# Patient Record
Sex: Female | Born: 1995 | Race: White | Hispanic: No | Marital: Single | Smoking: Never smoker
Health system: Southern US, Community
[De-identification: ages and names within clinical notes are randomized; demographics above are authoritative.]

## PROBLEM LIST (undated history)

## (undated) DIAGNOSIS — K900 Celiac disease: Secondary | ICD-10-CM

## (undated) HISTORY — DX: Celiac disease: K90.0

---
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
## 2017-09-09 ENCOUNTER — Other Ambulatory Visit: Payer: Self-pay

## 2017-09-09 ENCOUNTER — Encounter: Payer: Self-pay | Admitting: Physician Assistant

## 2017-09-09 ENCOUNTER — Ambulatory Visit: Payer: BC Managed Care – PPO | Admitting: Physician Assistant

## 2017-09-09 VITALS — BP 108/69 | HR 92 | Temp 98.2°F | Resp 16 | Ht 62.0 in | Wt 105.8 lb

## 2017-09-09 DIAGNOSIS — Z23 Encounter for immunization: Secondary | ICD-10-CM | POA: Diagnosis not present

## 2017-09-09 DIAGNOSIS — Z3041 Encounter for surveillance of contraceptive pills: Secondary | ICD-10-CM | POA: Diagnosis not present

## 2017-09-09 DIAGNOSIS — G43809 Other migraine, not intractable, without status migrainosus: Secondary | ICD-10-CM | POA: Diagnosis not present

## 2017-09-09 MED ORDER — RIZATRIPTAN BENZOATE 10 MG PO TABS: 10.0000 mg | ORAL_TABLET | ORAL | 0 refills | Status: AC | PRN

## 2017-09-09 MED ORDER — LEVONORGESTREL-ETHINYL ESTRAD 0.1-20 MG-MCG PO TABS: 1.0000 | ORAL_TABLET | Freq: Every day | ORAL | 11 refills | Status: DC

## 2017-09-09 NOTE — Patient Instructions (Addendum)
Start taking Avian after your next period. If this birth control pill is not helping your acne, please schedule an appointment with your dermatologist.  This will hopefully cut down on your headache severity.  Come back and see me in 2-3 months for recheck.   For your migraines:  Preventative: Magnesium citrate '400mg'$ - '600mg'$  daily, riboflavin '400mg'$  daily, Coenzyme Q-10 '100mg'$  three times daily.   Rizatriptan: Take one tablet at the onset of your headache. If it does not improve the symptoms please take one additional tablet. Do not take more then 2 tablets in 24hrs. Do not take use more then 2 to 3 times in a week.  To prevent or relieve headaches, try the following:  Cool Compress. Lie down and place a cool compress on your head.   Avoid headache triggers. If certain foods or odors seem to have triggered your migraines in the past, avoid them. A headache diary might help you identify triggers.   Include physical activity in your daily routine. Try a daily walk or other moderate aerobic exercise.   Manage stress. Find healthy ways to cope with the stressors, such as delegating tasks on your to-do list.   Practice relaxation techniques. Try deep breathing, yoga, massage and visualization.   Eat regularly. Eating regularly scheduled meals and maintaining a healthy diet might help prevent headaches. Also, drink plenty of fluids.   Follow a regular sleep schedule. Sleep deprivation might contribute to headaches  Consider biofeedback. With this mind-body technique, you learn to control certain bodily functions - such as muscle tension, heart rate and blood pressure - to prevent headaches or reduce headache pain.    Thank you for coming in today. I hope you feel we met your needs.  Feel free to call PCP if you have any questions or further requests.  Please consider signing up for MyChart if you do not already have it, as this is a great way to communicate with me.  Best,  [name],  PA-C   IF you received an x-ray today, you will receive an invoice from [facility]. Please contact [facility] at [phone] with questions or concerns regarding your invoice.   IF you received labwork today, you will receive an invoice from [place]. Please contact [facility] at [phone] with questions or concerns regarding your invoice.   Our billing staff will not be able to assist you with questions regarding bills from these companies.  You will be contacted with the lab results as soon as they are available. The fastest way to get your results is to activate your My Chart account. Instructions are located on the last page of this paperwork. If you have not heard from [place] regarding the results in 2 weeks, please contact this office.

## 2017-09-09 NOTE — Progress Notes (Signed)
   Sara Oneal  MRN: [id number] DOB: Sep 19, 1995  PCP: Maryellen Pile, MD  Subjective:  Pt is a 22 year old female who presents to clinic for HA x 1 year. She is here today with her mother. Pt is a Consulting civil engineer at [employer].   Endorses migraines the week before or the week of her menstrual cycle. "Sometimes is a migraine sometimes just a HA" Sometimes she has to lay down and go to sleep. HA last 1-several hours. HA happen about 8x/month. Otherwise has not noticed triggers. Is uncertain if her abnormal sleep schedule plays a part in severity.  Tylenol helps.   She is taking Tri-Lo-Marzia for acne x 1 year. HA have worsened since she started taking this medication.  She has been on ocps x 3 years for acne.  H/o HA since high school.  [facility] - last OV was this summer. Her dermatologist is out on maternity leave.   She has tried:  Yaz  Review of Systems  Eyes: Negative for photophobia and visual disturbance.  Gastrointestinal: Negative for nausea and vomiting.  Neurological: Positive for headaches. Negative for dizziness, syncope and light-headedness.    Patient Active Problem List   Diagnosis Date Noted  . Celiac disease 03/12/2012  . Acne 03/12/2012    Current Outpatient Medications on File Prior to Visit  Medication Sig Dispense Refill  . Norgestimate-Ethinyl Estradiol Triphasic (TRI-LO-MARZIA) 0.18/0.215/0.25 MG-25 MCG tab Take 1 tablet by mouth daily.    . Cholecalciferol (VITAMIN D) 2000 UNITS tablet Take 2,000 Units by mouth daily.     No current facility-administered medications on file prior to visit.     No Known Allergies   Objective:  BP 108/69 (BP Location: Left Arm, Patient Position: Sitting, Cuff Size: Normal)   Pulse 92   Temp 98.2 F (36.8 C) (Oral)   Resp 16   Ht 5\' 2"  (1.575 m)   Wt 105 lb 12.8 oz (48 kg)   LMP 08/04/2017   SpO2 100%   BMI 19.35 kg/m   Physical Exam  Constitutional: She is oriented to person, place, and time. No distress.    Cardiovascular: Normal rate, regular rhythm and normal heart sounds.  Neurological: She is alert and oriented to person, place, and time.  Skin: Skin is warm and dry.  Psychiatric: Judgment normal.  Vitals reviewed.   Assessment and Plan :  1. Other migraine without status migrainosus, not intractable 2. Encounter for surveillance of contraceptive pills - Pt endorses worsening HA since starting Tri-Lo-Marzia. She receives care at [facility] derm for acne. Plan to change ocps as HA is a major SE of Tri-Lo-Marzia. Advised pt to f/u with her dermatologist. Maxalt for abortive therapy. RTC in 4-8 weeks if no improvement.   - rizatriptan (MAXALT) 10 MG tablet; Take 1 tablet (10 mg total) by mouth as needed for migraine. May repeat in 2 hours if needed  Dispense: 10 tablet; Refill: 0 - levonorgestrel-ethinyl estradiol (AVIANE) 0.1-20 MG-MCG tablet; Take 1 tablet by mouth daily.  Dispense: 1 Package; Refill: 11  3. Needs flu shot - Administered by CMA.  - Flu Vaccine QUAD 6+ mos PF IM (Fluarix Quad PF)  Marco Collie, PA-C  Primary Care at [facility] 09/09/2017 2:47 PM  Please note: Portions of this report may have been transcribed using dragon voice recognition software. Every effort was made to ensure accuracy; however, inadvertent computerized transcription errors may be present.

## 2017-09-16 ENCOUNTER — Telehealth: Payer: Self-pay | Admitting: Physician Assistant

## 2017-09-16 NOTE — Telephone Encounter (Signed)
Copied from CRM [id number]. Topic: Quick Communication - See Telephone Encounter >> Sep 16, 2017  7:19 AM Windy KalataMichael, Riyansh Gerstner L, NT wrote: CRM for notification. See Telephone encounter for: 09/16/17.  Patient mother is calling and states that when the patient was seen on 09/09/17 she was supposed to be prescribed a different birth control. She states the same medication was sent in for levonorgestrel-ethinyl estradiol (AVIANE) 0.1-20 MG-MCG tablet, and she states that is what she is on that is causing her headaches. She states her pediatrician suggested without estrogen.  [id number] [place], [place] - [address] [place] [id number] Phone: [phone] Fax: [phone]

## 2017-09-20 NOTE — Telephone Encounter (Signed)
Please advise. Dgaddy, CMA 

## 2017-09-24 ENCOUNTER — Other Ambulatory Visit: Payer: Self-pay | Admitting: Physician Assistant

## 2017-09-24 DIAGNOSIS — Z309 Encounter for contraceptive management, unspecified: Secondary | ICD-10-CM

## 2017-09-24 MED ORDER — NORETHINDRONE 0.35 MG PO TABS
1.0000 | ORAL_TABLET | Freq: Every day | ORAL | 11 refills | Status: DC
Start: 2017-09-24 — End: 2018-01-12

## 2017-11-11 ENCOUNTER — Telehealth: Payer: Self-pay | Admitting: Physician Assistant

## 2017-11-11 NOTE — Telephone Encounter (Signed)
Called pt's mother (per DPR) and was able to reschedule pt's appt with McVey from 12/01/17 to 11/30/17 at 2:00. Advised mother of time, building and late policy. Mother acknowledged.

## 2017-11-23 ENCOUNTER — Ambulatory Visit: Payer: BC Managed Care – PPO | Admitting: Physician Assistant

## 2017-11-30 ENCOUNTER — Encounter: Payer: Self-pay | Admitting: Physician Assistant

## 2017-11-30 ENCOUNTER — Ambulatory Visit: Payer: BC Managed Care – PPO | Admitting: Physician Assistant

## 2017-11-30 ENCOUNTER — Other Ambulatory Visit: Payer: Self-pay

## 2017-11-30 VITALS — BP 97/65 | HR 77 | Temp 98.3°F | Resp 16 | Ht 63.0 in | Wt 104.8 lb

## 2017-11-30 DIAGNOSIS — G43809 Other migraine, not intractable, without status migrainosus: Secondary | ICD-10-CM | POA: Diagnosis not present

## 2017-11-30 MED ORDER — AMITRIPTYLINE HCL 10 MG PO TABS: 10.0000 mg | ORAL_TABLET | Freq: Every day | ORAL | 2 refills | Status: DC

## 2017-11-30 NOTE — Progress Notes (Signed)
Sara Oneal  MRN: [id number] DOB: Apr 28, 1995  PCP: Sara Pile, MD  Subjective:  Pt is a 22 year old female who presents to clinic for f/u HA.  She is here today with her mother. Pt is a Holiday representative at [employer].  Last OV for this problem 09/09/2017. At that time she endorsed migraines the week before or the week of her menstrual cycle, which was worse after starting Tri-Lo-Marzia.  She was prescribed norethindrone and Maxalt for abortive therapy two months ago. Advised preventative:Magnesium citrate 400mg - 600mg  daily, riboflavin 400mg  daily, Coenzyme Q-10 100mg  three times daily.   She has not yet had period since starting norethindrone.  She has taken one dose of maxalt - made her drowsy and she went to sleep.  She has not tried any supplements.  She is drinking about 1 12-oz bottle of water daily.  She is not eating 3 fulls meals/day   She is having HA about every 2-4 days.  For mild HA, they last 1-2 hours.  For bad HA they will last until she lays down to go to sleep.   Review of Systems  Eyes: Negative for photophobia and visual disturbance.  Neurological: Positive for headaches. Negative for dizziness, weakness and light-headedness.    Patient Active Problem List   Diagnosis Date Noted  . Celiac disease 03/12/2012  . Acne 03/12/2012    Current Outpatient Medications on File Prior to Visit  Medication Sig Dispense Refill  . norethindrone (MICRONOR,CAMILA,ERRIN) 0.35 MG tablet Take 1 tablet (0.35 mg total) by mouth daily. 1 Package 11  . rizatriptan (MAXALT) 10 MG tablet Take 1 tablet (10 mg total) by mouth as needed for migraine. May repeat in 2 hours if needed 10 tablet 0  . Cholecalciferol (VITAMIN D) 2000 UNITS tablet Take 2,000 Units by mouth daily.    Marland Kitchen levonorgestrel-ethinyl estradiol (AVIANE) 0.1-20 MG-MCG tablet Take 1 tablet by mouth daily. (Patient not taking: Reported on 11/30/2017) 1 Package 11  . Norgestimate-Ethinyl Estradiol Triphasic (TRI-LO-MARZIA)  0.18/0.215/0.25 MG-25 MCG tab Take 1 tablet by mouth daily.     No current facility-administered medications on file prior to visit.     No Known Allergies   Objective:  BP 97/65 (BP Location: Right Arm, Patient Position: Sitting, Cuff Size: Normal)   Pulse 77   Temp 98.3 F (36.8 C) (Oral)   Resp 16   Ht 5\' 3"  (1.6 m)   Wt 104 lb 12.8 oz (47.5 kg)   LMP 09/15/2017 Comment: pt stated it's because of change in birth control  SpO2 98%   BMI 18.56 kg/m   Physical Exam  Constitutional: She is oriented to person, place, and time. She appears well-developed and well-nourished. No distress.  Cardiovascular: Normal rate, regular rhythm and normal heart sounds.  Neurological: She is alert and oriented to person, place, and time.  Skin: Skin is warm and dry.  Psychiatric: Judgment normal.  Vitals reviewed.   Assessment and Plan :  1. Other migraine without status migrainosus, not intractable - pt presents for f/u HA. She has take one dose of Maxalt 10mg , which made her sleepy. Advised pt to take 1/2 pill PRN. Encouraged at least 50 oz water/daily with regular meals. She will start supplements. If no improvement she will try amitriptyline 10mg  q hs. Medication safety issues, dosing schedule and side effect profile discussed and printed out. Okay to increase dose after 2-3 weeks if needed. RTC in 3 months to recheck symptoms. Consider referral to HA clinic.   -  amitriptyline (ELAVIL) 10 MG tablet; Take 1 tablet (10 mg total) by mouth at bedtime.  Dispense: 30 tablet; Refill: 2   Marco CollieWhitney Marriana Hibberd, PA-C  [facility] at [facility] 11/30/2017 2:24 PM  Please note: Portions of this report may have been transcribed using dragon voice recognition software. Every effort was made to ensure accuracy; however, inadvertent computerized transcription errors may be present.

## 2017-11-30 NOTE — Patient Instructions (Addendum)
If water and regular meals can help your headaches, I recommend that instead of medications.   Drink half your body weight in oz of water per day. So for you try to drink at least 50oz/day.   Start taking 1/2 pill of rizatriptan at the onset of your headache. So, 5mg .  If it does not improve the symptoms please take one additional tablet. Do not take more then 2 tablets in 24hrs. Do not take use more then 2 to 3 times in a week.  Preventative: Magnesium citrate 400mg  - 600mg  daily, riboflavin 400mg  daily, Coenzyme Q 10 100mg  three times daily.  If this is not working, start taking Amitriptyline 10mg  at bedtime. (This is a very low starting dose. Dosage range 20 to 50 mg at bedtime. Let me know if you feel like you need to increase this dose, we can increase the dose every 2-3 weeks.) I would like this medication to be a last resort. This is an antidepressant, which has an alternative use for migraine prevention. side effects of tricyclics include dry mouth, constipation, tachycardia, palpitations, orthostatic hypotension, weight gain, blurred vision, and urinary retention.  Discontinuation of therapy: When discontinuing Amitriptyline treatment that has lasted for >3 weeks, gradually taper the dose down (eg, over 2 - 4 weeks) to minimize withdrawal symptoms and detect reemerging symptoms    Feel free to call PCP if you have any questions or further requests.  Please consider signing up for MyChart if you do not already have it, as this is a great way to communicate with me.  Best,  CitigroupWhitney McVey, PA-C

## 2017-12-01 ENCOUNTER — Ambulatory Visit: Payer: BC Managed Care – PPO | Admitting: Physician Assistant

## 2017-12-26 ENCOUNTER — Other Ambulatory Visit: Payer: Self-pay | Admitting: Physician Assistant

## 2017-12-26 DIAGNOSIS — G43809 Other migraine, not intractable, without status migrainosus: Secondary | ICD-10-CM

## 2017-12-27 NOTE — Telephone Encounter (Signed)
Requested medication (s) are due for refill today: Yes  Requested medication (s) are on the active medication list: Yes  Last refill:  11/30/17  Future visit scheduled: Yes  Notes to clinic:  Pharmacy asking for diagnosis code.    Requested Prescriptions  Pending Prescriptions Disp Refills   amitriptyline (ELAVIL) 10 MG tablet [Pharmacy Med Name: AMITRIPTYLINE HCL 10 MG TAB] 90 tablet 1    Sig: TAKE 1 TABLET BY MOUTH EVERYDAY AT BEDTIME     Psychiatry:  Antidepressants - Heterocyclics (TCAs) Passed - 12/26/2017 10:33 AM      Passed - Valid encounter within last 6 months    Recent Outpatient Visits          3 weeks ago Other migraine without status migrainosus, not intractable   [facility] McVey, NapoleonElizabeth Whitney, PA-C   3 months ago Other migraine without status migrainosus, not intractable   [facility] at [facility] Mc[facility]ey, Madelaine BhatElizabeth Whitney, PA-C   1 year ago Closed nondisplaced fracture of distal phalanx of lesser toe of left foot with routine healing, subsequent encounter   [facility] Ho[facility], Sandria Balesavid H, MD   1 year ago Toe pain, left   [facility], Asencion PartridgeJeffrey R, MD   4 years ago Pain in right wrist   [facility], Asencion PartridgeJeffrey R, MD      Future Appointments            In 2 weeks Neva SeatGreene, Asencion PartridgeJeffrey R, MD [facility]

## 2017-12-30 NOTE — Telephone Encounter (Signed)
Patient is requesting a refill of the following medications: Requested Prescriptions   Pending Prescriptions Disp Refills  . amitriptyline (ELAVIL) 10 MG tablet [Pharmacy Med Name: AMITRIPTYLINE HCL 10 MG TAB] 90 tablet 1    Sig: TAKE 1 TABLET BY MOUTH EVERYDAY AT BEDTIME    Date of patient request:12/27/17 Last office visit: 11/30/17 Date of last refill: 11/126/19 Last refill amount: #30 Follow up time period per chart: n/a

## 2018-01-12 ENCOUNTER — Ambulatory Visit: Payer: BC Managed Care – PPO | Admitting: Family Medicine

## 2018-01-12 ENCOUNTER — Encounter: Payer: Self-pay | Admitting: Family Medicine

## 2018-01-12 ENCOUNTER — Other Ambulatory Visit: Payer: Self-pay

## 2018-01-12 VITALS — BP 99/65 | HR 89 | Temp 98.7°F | Resp 14 | Ht 63.0 in | Wt 106.4 lb

## 2018-01-12 DIAGNOSIS — IMO0002 Reserved for concepts with insufficient information to code with codable children: Secondary | ICD-10-CM

## 2018-01-12 DIAGNOSIS — N910 Primary amenorrhea: Secondary | ICD-10-CM | POA: Diagnosis not present

## 2018-01-12 DIAGNOSIS — G43709 Chronic migraine without aura, not intractable, without status migrainosus: Secondary | ICD-10-CM | POA: Diagnosis not present

## 2018-01-12 DIAGNOSIS — F439 Reaction to severe stress, unspecified: Secondary | ICD-10-CM | POA: Diagnosis not present

## 2018-01-12 DIAGNOSIS — R519 Headache, unspecified: Secondary | ICD-10-CM

## 2018-01-12 DIAGNOSIS — N926 Irregular menstruation, unspecified: Secondary | ICD-10-CM | POA: Diagnosis not present

## 2018-01-12 DIAGNOSIS — R51 Headache: Secondary | ICD-10-CM

## 2018-01-12 LAB — POCT URINE PREGNANCY: PREG TEST UR: NEGATIVE

## 2018-01-12 NOTE — Patient Instructions (Addendum)
See information on stress management, and I would recommend meeting with counseling at school to discuss stress management.  Increase fluid intake with goal of 50 to 60 ounces of water per day.  Make sure to eat regular meals and snacks in between as needed. If persistent headache, can try the amitriptyline once per night.  I will also refer you to headache specialist in [place].  If no onset of menses in the next month, would recommend further evaluation for possible other tests.  Thank you for coming in today.  Please let me know if there are questions.   Return to the clinic or go to the nearest emergency room if any of your symptoms worsen or new symptoms occur.      If you have lab work done today you will be contacted with your lab results within the next 2 weeks.  If you have not heard from [place] then please contact us. The fastest way to get your results is to register for My Chart.   IF you received an x-ray today, you will receive an invoice from [facility]. Please contact [facility] at [phone] with questions or concerns regarding your invoice.   IF you received labwork today, you will receive an invoice from [place]. Please contact [facility] at [phone] with questions or concerns regarding your invoice.   Our billing staff will not be able to assist you with questions regarding bills from these companies.  You will be contacted with the lab results as soon as they are available. The fastest way to get your results is to activate your My Chart account. Instructions are located on the last page of this paperwork. If you have not heard from [place] regarding the results in 2 weeks, please contact this office.

## 2018-01-12 NOTE — Progress Notes (Signed)
Subjective:    Patient ID: Sara Oneal, female    DOB: 03/28/1995, 22 y.o.   MRN: [id number]  HPI Sara Oneal is a 23 y.o. female Presents today for: Chief Complaint  Patient presents with  . Follow-up    was seen by [facility] on 11/30/2017 was given amitriptyline for the headache have not taking this med but have it on hand in case need it. Headache is not amuch better   Headache:  follow-up of migraine headaches.  Last seen in November 26, Sara Oneal.  Per review of notes had been seen in September with migraines week before menstrual cycle.  Worse after starting prior OCP.   She was prescribed norethindrone and then Maxalt for abortive therapy 2 months prior.  Also had recommended magnesium 400 to 600 mg/day, riboflavin 40 mg daily, and coenzyme Q 10 100 mg 3 times daily. At November visit had only taken 1 dose of Maxalt which made her drowsy.  Had not started any supplements at that time.  Drinking 12 ounce bottle of water per day.  Suffered from headaches about every 2 to 4 days at that time, mild headaches lasting 1 to 2 hours, bad headaches lasted until she went to sleep.  Advised increased hydration and starting previously recommended supplements.  Option of Elavil 10 mg nightly.  And option of 1/2 pill of Maxalt if needed.  Here with parent.  Change in OCP did not seem to help HA, so returned to Tri Lo Estarylla last Sunday as norithendrone did not help HA's and acne got worse.  Discussed with derm - recommended returning to prior OCP.  Increased water intake- currently about 32 ounces of water per day.   Had eyes checked at optometry - only needed Rx glasses for distance only with mild Rx in 1 eye only.  Parent feels like HA's have improved, less migraine type HA. More mild HA: Tx: no nsaids, but has taken tylenol.  No supplements.  Prior 2-3x/week.  LMP: September 2019 - not since starting OCP.  Did not use elavil yet. Concerned about side effects. 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  Stress with school.    HA log:  11/28 - migraine HA - typical. No maxalt or 1/2 maxalt.  12/2: mild HA, tx tylenol 12/8: mild HA 12/12: mild HA - after driving from [place] (notes HA after driving). Tylenol  12/16: mild HA 12/20: mild HA - tylenol.  12/28: mild HA - posterior 1/1: allergy problems, congestion, scratchy throat.    Tx: venting to mom. Some walking, no regular exercise.  [employer], communications, Holiday representative.  Not sexually active   Depression screen [facility] 2/9 01/12/2018 11/30/2017 09/09/2017 09/25/2016 08/11/2016  Decreased Interest 0 0 0 0 0  Down, Depressed, Hopeless 0 0 0 0 0  PHQ - 2 Score 0 0 0 0 0     Patient Active Problem List   Diagnosis Date Noted  . Celiac disease 03/12/2012  . Acne 03/12/2012   Past Medical History:  Diagnosis Date  . Celiac disease    History reviewed. No pertinent surgical history. No Known Allergies Prior to Admission medications   Medication Sig Start Date End Date Taking? Authorizing Provider  Norgestimate-Ethinyl Estradiol Triphasic (TRI-LO-ESTARYLLA) 0.18/0.215/0.25 MG-25 MCG tab Take 1 tablet by mouth daily.   Yes [provider]  amitriptyline (ELAVIL) 10 MG tablet TAKE 1 TABLET BY MOUTH EVERYDAY AT BEDTIME Patient not taking: Reported on 01/12/2018 12/30/17   Oneal, Sara Bhat, PA-C  rizatriptan (MAXALT) 10 MG tablet Take 1 tablet (10 mg total) by mouth as needed for migraine. May repeat in 2 hours if needed 09/09/17   Oneal, Sara BhatElizabeth Whitney, PA-C   Social History   Socioeconomic History  . Marital status: Single    Spouse name: Not on file  . Number of children: Not on file  . Years of education: Not on file  . Highest education level: Not on file  Occupational History  . Not on file  Social Needs  . Financial resource strain: Not on file  . Food insecurity:    Worry: Not on file    Inability: Not on file  . Transportation needs:    Medical: Not on file    Non-medical: Not on  file  Tobacco Use  . Smoking status: Never Smoker  . Smokeless tobacco: Never Used  Substance and Sexual Activity  . Alcohol use: Not on file  . Drug use: Not on file  . Sexual activity: Not on file  Lifestyle  . Physical activity:    Days per week: Not on file    Minutes per session: Not on file  . Stress: Not on file  Relationships  . Social connections:    Talks on phone: Not on file    Gets together: Not on file    Attends religious service: Not on file    Active member of club or organization: Not on file    Attends meetings of clubs or organizations: Not on file    Relationship status: Not on file  . Intimate partner violence:    Fear of current or ex partner: Not on file    Emotionally abused: Not on file    Physically abused: Not on file    Forced sexual activity: Not on file  Other Topics Concern  . Not on file  Social History Narrative  . Not on file     Review of Systems Per HPI.     Objective:   Physical Exam Constitutional:      General: She is not in acute distress.    Appearance: She is well-developed.  HENT:     Head: Normocephalic and atraumatic.  Cardiovascular:     Rate and Rhythm: Normal rate.  Pulmonary:     Effort: Pulmonary effort is normal.  Neurological:     General: No focal deficit present.     Mental Status: She is alert and oriented to person, place, and time.     Gait: Gait normal.  Psychiatric:        Mood and Affect: Mood normal.        Thought Content: Thought content normal.    Vitals:   01/12/18 1538  BP: 99/65  Pulse: 89  Resp: 14  Temp: 98.7 F (37.1 C)  TempSrc: Oral  SpO2: 97%  Weight: 106 lb 6.4 oz (48.3 kg)  Height: 5\' 3"  (1.6 m)   Results for orders placed or performed in visit on 01/12/18  POCT urine pregnancy  Result Value Ref Range   Preg Test, Ur Negative Negative   Wt Readings from Last 3 Encounters:  01/12/18 106 lb 6.4 oz (48.3 kg)  11/30/17 104 lb 12.8 oz (47.5 kg)  09/09/17 105 lb 12.8 oz  (48 kg)  Body mass index is 18.85 kg/m.      Assessment & Plan:  Sara SoxHolly K Conard is a 23 y.o. female Chronic migraine - Plan: Ambulatory referral to Neurology Situational stress Nonintractable episodic headache, unspecified headache  type - Plan: Ambulatory referral to Neurology  -Possible multifactorial with more mild headaches recently.  May be due to time away from school and school stressors.  Still discussed impact of hydration, stress management, possible hormonal component of headaches.  -Continue to increase fluid intake, regular meals with snacks as needed  -Option of Elavil 10 mg nightly that was prescribed previously  -Handout on stress management but also has option of meeting with counselors through school to discuss stress/school stressors  -[place].  Irregular menses - Plan: POCT urine pregnancy Delayed menses - Plan: POCT urine pregnancy  -Now back on previous OCP, negative hCG.  Possible hypothalamic with stress.   -Discussed blood work today, but deferred at present.  If still no menses next month would recommend TSH, prolactin, and can be done through student health/provider locally in [place] if needed.   No orders of the defined types were placed in this encounter.  Patient Instructions   See information on stress management, and I would recommend meeting with counseling at school to discuss stress management.  Increase fluid intake with goal of 50 to 60 ounces of water per day.  Make sure to eat regular meals and snacks in between as needed. If persistent headache, can try the amitriptyline once per night.  I will also refer you to headache specialist in [place].  If no onset of menses in the next month, would recommend further evaluation for possible other tests.  Thank you for coming in today.  Please let me know if there are questions.   Return to the clinic or go to the nearest emergency room if any of  your symptoms worsen or new symptoms occur.      If you have lab work done today you will be contacted with your lab results within the next 2 weeks.  If you have not heard from us then please contact us. The fastest way to get your results is to register for My Chart.   IF you received an x-ray today, you will receive an invoice from [facility]. Please contact [facility] at [phone] with questions or concerns regarding your invoice.   IF you received labwork today, you will receive an invoice from [facility]. Please contact [facility] at [phone] with questions or concerns regarding your invoice.   Our billing staff will not be able to assist you with questions regarding bills from these companies.  You will be contacted with the lab results as soon as they are available. The fastest way to get your results is to activate your My Chart account. Instructions are located on the last page of this paperwork. If you have not heard from us regarding the results in 2 weeks, please contact this office.      Signed,   Meredith StaggersJeffrey Kenzlie Disch, MD [facility] Care at [facility].  01/12/18 5:24 PM

## 2019-07-29 IMAGING — DX DG TOE 4TH 2+V*L*
3 series · 3 of 3 positions shown · non-contrast
Comparison: None.

CLINICAL DATA: Recent fracture with persistent pain

EXAM:
LEFT FOURTH TOE:  3 V

[toe ap]
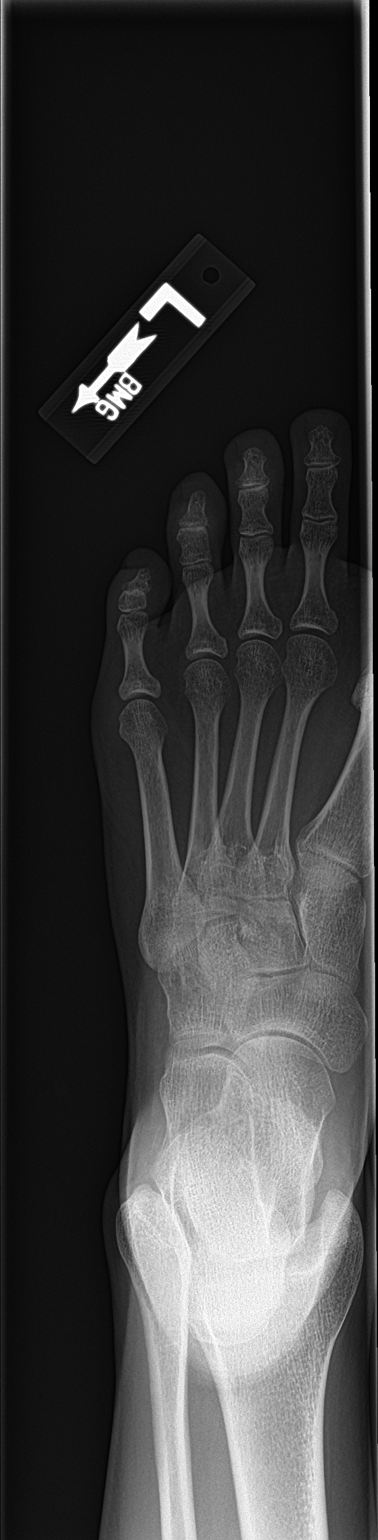

[toe obl]
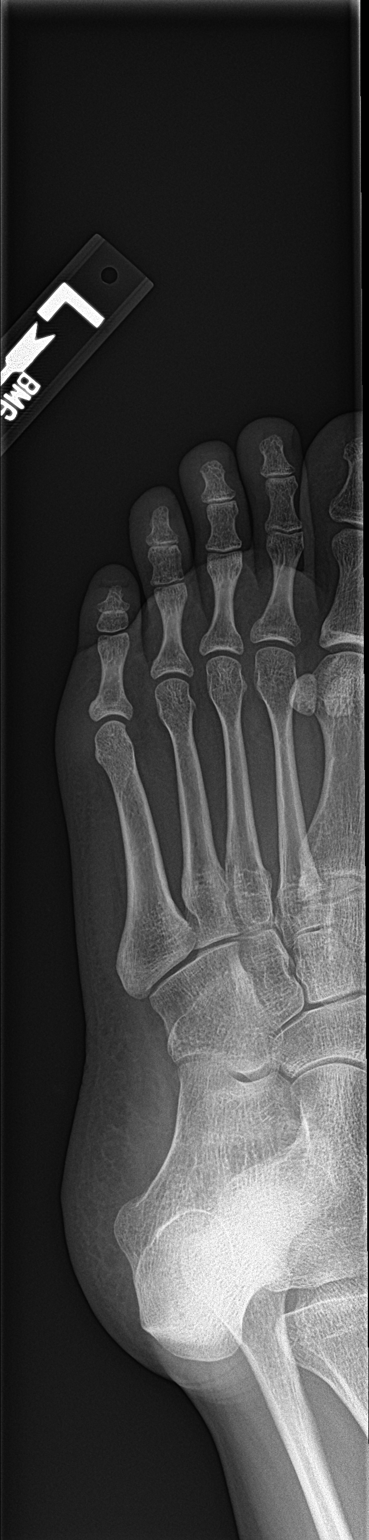

[toe lat]
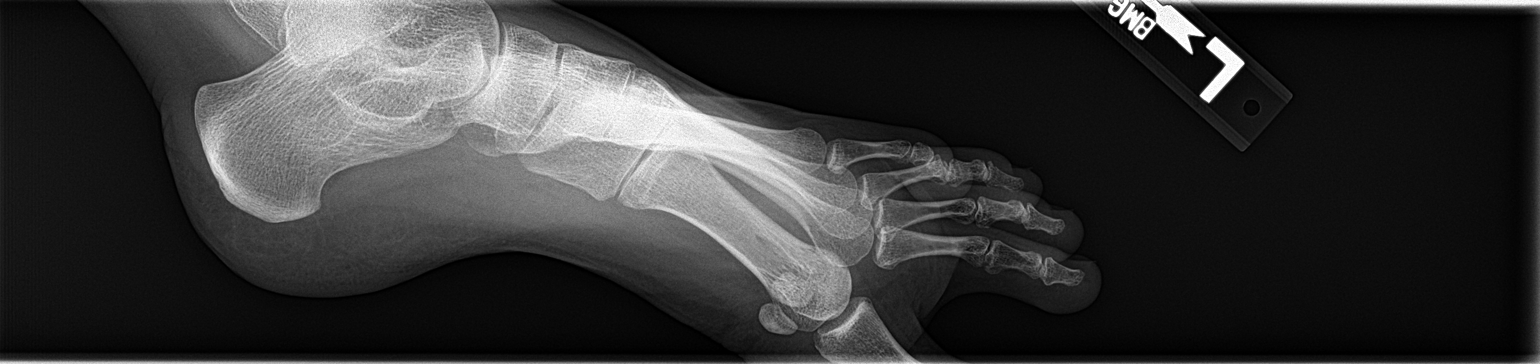

[3 of 3 positions shown; findings below may reference images not displayed]

FINDINGS: Frontal, oblique, and lateral views obtained. There is healing
response at the site of the fracture noted previously along the
medial proximal aspect of the fourth distal phalanx. Alignment is
anatomic in this area. A tiny calcification remains in the medial
fourth DIP joint, likely residua from the previous trauma. No new
fracture. No dislocation. Joint spaces appear normal. No erosive
change.
IMPRESSION: Healing fracture along the medial proximal aspect fourth distal
phalanx. Alignment essentially anatomic. Tiny calcification within
the medial fourth DIP joint likely is a small focus of residua from
the recent fracture. No new fracture. No dislocation. No evident
arthropathy.
